# Patient Record
Sex: Female | Born: 1980 | Race: White | Hispanic: No | Marital: Married | Smoking: Never smoker
Health system: Southern US, Community
[De-identification: ages and names within clinical notes are randomized; demographics above are authoritative.]

## PROBLEM LIST (undated history)

## (undated) DIAGNOSIS — B019 Varicella without complication: Secondary | ICD-10-CM

## (undated) DIAGNOSIS — C801 Malignant (primary) neoplasm, unspecified: Secondary | ICD-10-CM

## (undated) DIAGNOSIS — N390 Urinary tract infection, site not specified: Secondary | ICD-10-CM

## (undated) HISTORY — DX: Varicella without complication: B01.9

## (undated) HISTORY — DX: Urinary tract infection, site not specified: N39.0

## (undated) HISTORY — DX: Malignant (primary) neoplasm, unspecified: C80.1

---

## 1999-02-03 ENCOUNTER — Emergency Department (HOSPITAL_COMMUNITY): Admission: EM | Admit: 1999-02-03 | Discharge: 1999-02-03 | Payer: Self-pay | Admitting: Emergency Medicine

## 1999-09-28 ENCOUNTER — Emergency Department (HOSPITAL_COMMUNITY): Admission: EM | Admit: 1999-09-28 | Discharge: 1999-09-28 | Payer: Self-pay

## 2009-08-01 ENCOUNTER — Ambulatory Visit (HOSPITAL_COMMUNITY): Admission: RE | Admit: 2009-08-01 | Discharge: 2009-08-01 | Payer: Self-pay | Admitting: Gynecology

## 2011-01-10 IMAGING — RF DG HYSTEROGRAM
6 series · 6 of 6 positions shown · non-contrast
Comparison: none

CLINICAL DATA: Primary infertility.  Assess tubal patency

HYSTEROSALPINGOGRAM
TECHNIQUE: Hysterosalpingogram was performed by the ordering
physician under fluoroscopy.  Fluoroscopic images are submitted for
interpretation following the procedure.
Fluoroscopy Time:  1.5 minutes.

[Series 1: run · 1 of 1 slices shown (1 of 6)]
[im 1/1]
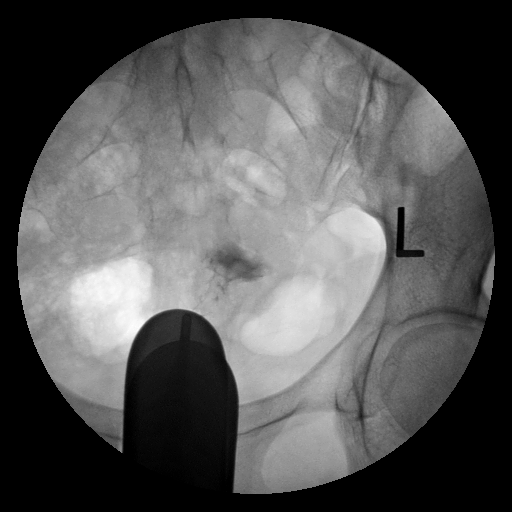

[Series 2: run · 1 of 1 slices shown (2 of 6)]
[im 1/1]
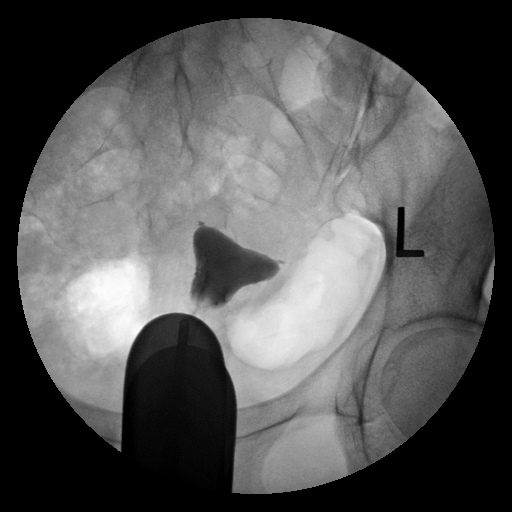

[Series 3: run · 1 of 1 slices shown (3 of 6)]
[im 1/1]
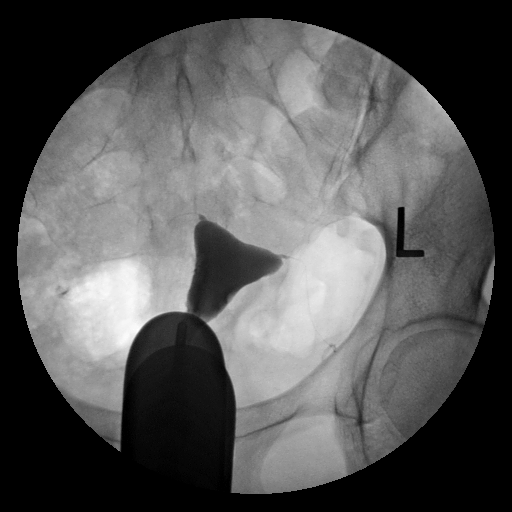

[Series 4: run · 1 of 1 slices shown (4 of 6)]
[im 1/1]
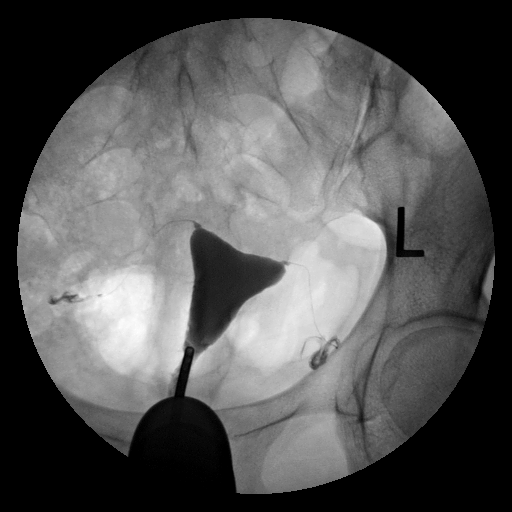

[Series 5: run · 1 of 1 slices shown (5 of 6)]
[im 1/1]
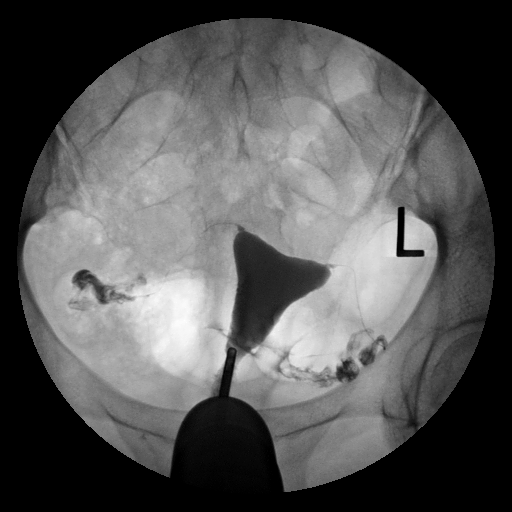

[Series 6: run · 1 of 1 slices shown (6 of 6)]
[im 1/1]
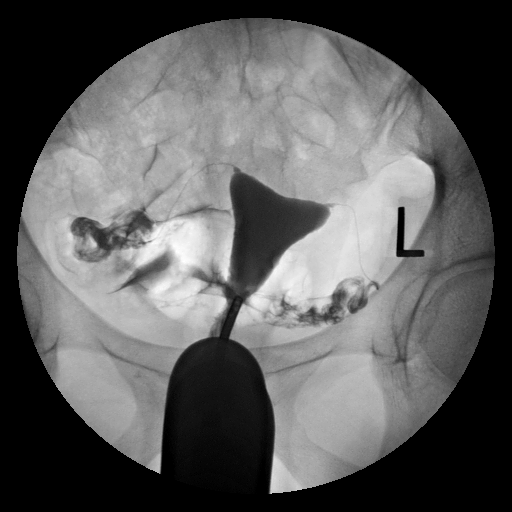

[6 of 6 positions shown; findings below may reference images not displayed]

FINDINGS: A normal endometrial morphology is seen.  Both fallopian
tubes have a normal morphology and bilateral free intraperitoneal
spill is seen.  No evidence for loculation of contrast is noted to
suggest the presence of peritubal or periovarian adhesions.
IMPRESSION: Normal HSG.

Approximately 10 ml of Omnipaque 300% was utilized for this exam.

## 2011-08-06 ENCOUNTER — Encounter: Payer: Self-pay | Admitting: Family Medicine

## 2011-08-06 ENCOUNTER — Ambulatory Visit (INDEPENDENT_AMBULATORY_CARE_PROVIDER_SITE_OTHER): Payer: BC Managed Care – PPO | Admitting: Family Medicine

## 2011-08-06 DIAGNOSIS — F341 Dysthymic disorder: Secondary | ICD-10-CM

## 2011-08-06 DIAGNOSIS — F329 Major depressive disorder, single episode, unspecified: Secondary | ICD-10-CM

## 2011-08-06 DIAGNOSIS — N979 Female infertility, unspecified: Secondary | ICD-10-CM

## 2011-08-06 NOTE — Progress Notes (Signed)
  Subjective:    Patient ID: Stephanie Owen, female    DOB: Jun 21, 1981, 31 y.o.   MRN: [id number]  HPI New to establish.  Previous MD- [facility], Eileen Stanford.  GYN- [facility].  Infertility- has had complete w/u w/ Dr Thamas Jaegers, dx'd w/ 'unexplained infertility'.  In process of saving for IVF.  Admits to some depression due to inability to have kids.  Has had 'crazy amount of testing'- uncertain as to what bloodwork has been done.   Review of Systems For ROS see HPI     Objective:   Physical Exam  Constitutional: She is oriented to person, place, and time. She appears well-developed and well-nourished. No distress.  HENT:  Head: Normocephalic and atraumatic.  Eyes: Conjunctivae and EOM are normal. Pupils are equal, round, and reactive to light.  Neck: Normal range of motion. Neck supple. No thyromegaly present.  Cardiovascular: Normal rate, regular rhythm, normal heart sounds and intact distal pulses.   No murmur heard. Pulmonary/Chest: Effort normal and breath sounds normal. No respiratory distress.  Musculoskeletal: She exhibits no edema.  Lymphadenopathy:    She has no cervical adenopathy.  Neurological: She is alert and oriented to person, place, and time.  Skin: Skin is warm and dry.  Psychiatric: She has a normal mood and affect. Her behavior is normal.          Assessment & Plan:

## 2011-08-06 NOTE — Patient Instructions (Signed)
Welcome!  We're glad to have you! Call with any questions or concerns Think of [place] as your home base- if you need anything, let us know! Hang in there!!

## 2011-08-09 DIAGNOSIS — N979 Female infertility, unspecified: Secondary | ICD-10-CM | POA: Insufficient documentation

## 2011-08-09 DIAGNOSIS — F329 Major depressive disorder, single episode, unspecified: Secondary | ICD-10-CM | POA: Insufficient documentation

## 2011-08-09 NOTE — Assessment & Plan Note (Signed)
New.  Pt admits to some depression over their infertility struggles.  Reports sxs are well controlled at this time b/c things are 'on hold' while they save $ for IVF.  Will continue to follow.

## 2011-08-09 NOTE — Assessment & Plan Note (Signed)
New.  Pt is going through work up and saving money for IVF.  Will continue to follow and assist as able.

## 2012-11-28 ENCOUNTER — Ambulatory Visit (INDEPENDENT_AMBULATORY_CARE_PROVIDER_SITE_OTHER): Payer: BC Managed Care – PPO | Admitting: Family Medicine

## 2012-11-28 ENCOUNTER — Encounter: Payer: Self-pay | Admitting: Family Medicine

## 2012-11-28 VITALS — BP 110/70 | HR 80 | Temp 98.2°F | Ht 60.5 in | Wt 108.0 lb

## 2012-11-28 DIAGNOSIS — IMO0002 Reserved for concepts with insufficient information to code with codable children: Secondary | ICD-10-CM

## 2012-11-28 DIAGNOSIS — N912 Amenorrhea, unspecified: Secondary | ICD-10-CM

## 2012-11-28 DIAGNOSIS — S86899A Other injury of other muscle(s) and tendon(s) at lower leg level, unspecified leg, initial encounter: Secondary | ICD-10-CM

## 2012-11-28 LAB — HCG, QUANTITATIVE, PREGNANCY: hCG, Beta Chain, Quant, S: 0.91 m[IU]/mL

## 2012-11-28 MED ORDER — MELOXICAM 15 MG PO TABS: 15.0000 mg | ORAL_TABLET | Freq: Every day | ORAL | Status: DC

## 2012-11-28 NOTE — Progress Notes (Signed)
  Subjective:    Patient ID: [name], female    DOB: 08-06-1980, 32 y.o.   MRN: [id number]  HPI Leg pain- 'horrible pain for over a month now'.  R lower leg.  Feels it may be shin splints b/c she is doing a lot more running than previous.  'constantly hurts'.  Will throb at rest and a stabbing pain w/ walking.  No pain while running but severe pain after.  Amenorrhea- period is 2 weeks late and pt has been trying to conceive x7 yrs.   Review of Systems For ROS see HPI     Objective:   Physical Exam  Vitals reviewed. Constitutional: She is oriented to person, place, and time. She appears well-developed and well-nourished. No distress.  Musculoskeletal: Normal range of motion. She exhibits tenderness (TTP of R anteromedial tibia.  no pain w/ placement of tuning fork on tibia.  no obvious bony abnormality). She exhibits no edema.  Neurological: She is alert and oriented to person, place, and time. She has normal reflexes. Coordination normal.  Skin: Skin is warm and dry.  Psychiatric: She has a normal mood and affect. Her behavior is normal.          Assessment & Plan:

## 2012-11-28 NOTE — Patient Instructions (Addendum)
We'll notify you of your lab results DO NOT take the Mobic until we know if you are pregnant ICE your shins daily Do the toe taps to strengthen your anterior shin muscles Compression socks while running Try and vary your exercises STRETCH regularly If no improvement, please call and we'll refer you to Sports Med Hang in there!!!

## 2012-11-29 ENCOUNTER — Telehealth: Payer: Self-pay | Admitting: Family Medicine

## 2012-11-29 NOTE — Assessment & Plan Note (Signed)
New.  Suspect this is due to pt's increase in mileage recently.  Start daily NSAID, ICE, reviewed appropriate stretching and need to strengthen tibialis anterior muscles.  If no improvement, pt to call and we'll refer to sports med.  Will follow.

## 2012-11-29 NOTE — Telephone Encounter (Signed)
Spoke with the pt and informed her of recent lab results and note.  Pt understood.//AB/CMA

## 2012-11-29 NOTE — Telephone Encounter (Signed)
Patient is requesting lab results from yesterday.  °

## 2012-11-29 NOTE — Telephone Encounter (Signed)
LM asking the pt to RTC regarding her lab results.//AB/CMA

## 2012-11-29 NOTE — Assessment & Plan Note (Signed)
New.  Pt is 2 weeks late for period.  Trying to conceive for 7 yrs.  Home Upreg (-) x2.  Will get serum beta HCG.

## 2013-02-13 ENCOUNTER — Telehealth: Payer: Self-pay | Admitting: Family Medicine

## 2013-02-13 DIAGNOSIS — S86899A Other injury of other muscle(s) and tendon(s) at lower leg level, unspecified leg, initial encounter: Secondary | ICD-10-CM

## 2013-02-13 NOTE — Telephone Encounter (Signed)
Patient states that her shin splints that she complained about at her last visit, 11/28/12, are not improving and wants to know if she can be referred to a Sports Medicine specialist. Please advise.

## 2013-02-13 NOTE — Telephone Encounter (Signed)
Referral placed for sports medicine as stated in prior office note.

## 2013-02-15 ENCOUNTER — Ambulatory Visit (INDEPENDENT_AMBULATORY_CARE_PROVIDER_SITE_OTHER): Payer: BC Managed Care – PPO | Admitting: Family Medicine

## 2013-02-15 ENCOUNTER — Encounter: Payer: Self-pay | Admitting: Family Medicine

## 2013-02-15 VITALS — BP 115/67 | HR 76 | Ht 61.0 in | Wt 105.0 lb

## 2013-02-15 DIAGNOSIS — S86899A Other injury of other muscle(s) and tendon(s) at lower leg level, unspecified leg, initial encounter: Secondary | ICD-10-CM

## 2013-02-15 DIAGNOSIS — IMO0002 Reserved for concepts with insufficient information to code with codable children: Secondary | ICD-10-CM

## 2013-02-15 NOTE — Patient Instructions (Addendum)
You have shin splints (medial tibial stress syndrome) Take tylenol and/or aleve as needed for pain. Icing 3-4 times a day and after activity for 15 minutes at a time Cut down activities by 20-50% (especially running). Orthotics or shoe inserts may be helpful if you have foot breakdown or high arches - Dr. Jari Sportsman active series. Cross train with non-impact activities (cycling, swimming) every other day if you are unable to run because of pain. Heel and toe walking exercises to strengthen muscles of lower leg. Calf raise on a step 3 sets of 10 once a day as shown. Buy new shoes at least yearly - this may make a difference for you as well. Follow up with me in 6 weeks or as needed.

## 2013-02-16 ENCOUNTER — Encounter: Payer: Self-pay | Admitting: Family Medicine

## 2013-02-16 NOTE — Progress Notes (Signed)
Patient ID: Stephanie Owen, female   DOB: [birth year], 32 y.o.   MRN: [id number]  PCP: Neena Rhymes, MD  Subjective:   HPI: Patient is a 32 y.o. female here for bilateral shin pain.  Patient states she runs about 25-30 miles per week. Is currently training for a half marathon in October. About April-May she started to develop bilateral shin pain, right worse than left. Occurred primarily after a run initially and has progressed to while running and now even with walking it bothers her. Compression sleeves help some. Has iced. Continued to run through this. Not taking any medicines for pain.  Past Medical History  Diagnosis Date  . Chicken pox   . UTI (urinary tract infection)     Current Outpatient Prescriptions on File Prior to Visit  Medication Sig Dispense Refill  . meloxicam (MOBIC) 15 MG tablet Take 1 tablet (15 mg total) by mouth daily.  30 tablet  3   No current facility-administered medications on file prior to visit.    History reviewed. No pertinent past surgical history.  No Known Allergies  History   Social History  . Marital Status: Married    Spouse Name: N/A    Number of Children: N/A  . Years of Education: N/A   Occupational History  . Not on file.   Social History Main Topics  . Smoking status: Never Smoker   . Smokeless tobacco: Not on file  . Alcohol Use: Yes     Comment: socailly  . Drug Use: No  . Sexual Activity: Not on file   Other Topics Concern  . Not on file   Social History Narrative  . No narrative on file    Family History  Problem Relation Age of Onset  . Hypertension Father   . Sudden death Neg Hx   . Hyperlipidemia Neg Hx   . Heart attack Neg Hx   . Diabetes Neg Hx     BP 115/67  Pulse 76  Ht 5\' 1"  (1.549 m)  Wt 105 lb (47.628 kg)  BMI 19.85 kg/m2  Review of Systems: See HPI above.    Objective:  Physical Exam:  Gen: NAD  Bilateral lower legs: Mild cavus deformity of arches. No other deformity,  bruising, swelling. TTP middle 1/3rd bilateral medial tibias.  No other TTP knee, ankle, legs. FROM ankle and knee without pain, 5/5 strength ankle motions without reproduction of pain. Able to complete hop test with mild pain both shins. Negative fulcrum. NVI distally.  MSK u/s:  Bilateral shins - no evidence of cortical irregularity, edema overlying cortex, increased neovascularity to suggest stress fracture.    Assessment & Plan:  1. Shin splints - discussed dropping running mileage down by 20-50% and not to increase volume at this time.  Icing, strengthening exercises reviewed.  Shoes are about 33 years old - advised to replace these.  Start with Dr. Jari Sportsman active series insoles for support of cavus arches and for cushion from impact.  Tylenol and/or nsaids as needed for pain.  F/u in 6 weeks for reevaluation.

## 2013-02-17 ENCOUNTER — Encounter: Payer: Self-pay | Admitting: Family Medicine

## 2013-02-17 NOTE — Assessment & Plan Note (Signed)
discussed dropping running mileage down by 20-50% and not to increase volume at this time.  Icing, strengthening exercises reviewed.  Shoes are about 33 years old - advised to replace these.  Start with Dr. Jari Sportsman active series insoles for support of cavus arches and for cushion from impact.  Tylenol and/or nsaids as needed for pain.  F/u in 6 weeks for reevaluation.

## 2013-04-18 ENCOUNTER — Ambulatory Visit (INDEPENDENT_AMBULATORY_CARE_PROVIDER_SITE_OTHER): Payer: BC Managed Care – PPO

## 2013-04-18 DIAGNOSIS — Z23 Encounter for immunization: Secondary | ICD-10-CM

## 2013-06-14 ENCOUNTER — Encounter: Payer: Self-pay | Admitting: Family Medicine

## 2013-06-14 ENCOUNTER — Ambulatory Visit (INDEPENDENT_AMBULATORY_CARE_PROVIDER_SITE_OTHER): Payer: BC Managed Care – PPO | Admitting: Family Medicine

## 2013-06-14 VITALS — BP 124/80 | HR 81 | Temp 98.4°F | Resp 16 | Wt 112.1 lb

## 2013-06-14 DIAGNOSIS — J329 Chronic sinusitis, unspecified: Secondary | ICD-10-CM

## 2013-06-14 MED ORDER — BENZONATATE 200 MG PO CAPS: 200.0000 mg | ORAL_CAPSULE | Freq: Three times a day (TID) | ORAL | Status: DC | PRN

## 2013-06-14 MED ORDER — PROMETHAZINE-DM 6.25-15 MG/5ML PO SYRP: 5.0000 mL | ORAL_SOLUTION | Freq: Four times a day (QID) | ORAL | Status: DC | PRN

## 2013-06-14 MED ORDER — AZITHROMYCIN 250 MG PO TABS: ORAL_TABLET | ORAL | Status: DC

## 2013-06-14 NOTE — Patient Instructions (Signed)
Follow up as needed Start the Zpack as directed Drink plenty of fluids Use the cough syrup for nights and weekends (will cause drowsiness) Use the cough pills for daytime Add Sudafed short term, 3-5 days to dry up your congestion and drainage Call with any questions or concerns Hang in there! Happy Holidays!

## 2013-06-14 NOTE — Assessment & Plan Note (Signed)
Pt's sxs and PE consistent w/ infxn.  Start abx.  Cough meds prn.  Reviewed supportive care and red flags that should prompt return.  Pt expressed understanding and is in agreement w/ plan.  

## 2013-06-14 NOTE — Progress Notes (Signed)
   Subjective:    Patient ID: Stephanie Owen, female    DOB: [birth year], 32 y.o.   MRN: [id number]  HPI URI- sxs started 10 days ago.  + head congestion, PND.  Now w/ deep, painful cough.  No facial pain/pressure.  + nausea.  No ear pain.  No tooth pain.  No fevers.  + sick contacts.   Review of Systems For ROS see HPI     Objective:   Physical Exam  Vitals reviewed. Constitutional: She appears well-developed and well-nourished. No distress.  HENT:  Head: Normocephalic and atraumatic.  Right Ear: Tympanic membrane normal.  Left Ear: Tympanic membrane normal.  Nose: Mucosal edema and rhinorrhea present. Right sinus exhibits maxillary sinus tenderness and frontal sinus tenderness. Left sinus exhibits maxillary sinus tenderness and frontal sinus tenderness.  Mouth/Throat: Uvula is midline and mucous membranes are normal. Posterior oropharyngeal erythema present. No oropharyngeal exudate.  Eyes: Conjunctivae and EOM are normal. Pupils are equal, round, and reactive to light.  Neck: Normal range of motion. Neck supple.  Cardiovascular: Normal rate, regular rhythm and normal heart sounds.   Pulmonary/Chest: Effort normal and breath sounds normal. No respiratory distress. She has no wheezes.  Deep hacking cough  Lymphadenopathy:    She has no cervical adenopathy.          Assessment & Plan:

## 2013-06-14 NOTE — Progress Notes (Signed)
Pre visit review using our clinic review tool, if applicable. No additional management support is needed unless otherwise documented below in the visit note. 

## 2013-06-15 ENCOUNTER — Ambulatory Visit: Payer: BC Managed Care – PPO | Admitting: Family Medicine

## 2013-07-26 ENCOUNTER — Ambulatory Visit (INDEPENDENT_AMBULATORY_CARE_PROVIDER_SITE_OTHER): Payer: BC Managed Care – PPO | Admitting: Family

## 2013-07-26 ENCOUNTER — Encounter: Payer: Self-pay | Admitting: Family

## 2013-07-26 VITALS — BP 102/64 | HR 72 | Temp 98.0°F | Resp 16 | Ht 60.5 in | Wt 113.0 lb

## 2013-07-26 DIAGNOSIS — K529 Noninfective gastroenteritis and colitis, unspecified: Secondary | ICD-10-CM | POA: Insufficient documentation

## 2013-07-26 DIAGNOSIS — K5289 Other specified noninfective gastroenteritis and colitis: Secondary | ICD-10-CM

## 2013-07-26 MED ORDER — ONDANSETRON HCL 4 MG PO TABS: 4.0000 mg | ORAL_TABLET | Freq: Three times a day (TID) | ORAL | Status: DC | PRN

## 2013-07-26 NOTE — Patient Instructions (Signed)
Call if abdominal pain worsens, if you cannot keep down liquids, if you develop fever >101 or if you are not feeling better in 1-2 days. Go to ER if you develop severe abdominal pain.

## 2013-07-26 NOTE — Progress Notes (Signed)
Pre visit review using our clinic review tool, if applicable. No additional management support is needed unless otherwise documented below in the visit note. 

## 2013-07-26 NOTE — Progress Notes (Signed)
   Subjective:    Patient ID: Stephanie Owen, female    DOB: 11/12/1980, 33 y.o.   MRN: [id number]  HPI  Stephanie Owen is a 33 yr old female who presents today with chief complaint of nausea. She reports that Saturday 5AM woke up with chills/shaking, nausea, began vomitting x 1-2 hrs.  Nausea continued that day. Had tactile temp.  Woke up at noon.  Went to urgent care.  She was tested negative for flu.  Was told that she had GI virus. Was given phenergan. Sunday she developed severe cramping in her abdomen.  Stayed home all day Sunday.  Yesterday she continued to have some abdominal cramping but developed diarrhea.  Tolerated some bland foods yesterday. Today has only tried clears (which she kept down). Has not tried solids today due to nausea.    Review of Systems    see HPI  Past Medical History  Diagnosis Date  . Chicken pox   . UTI (urinary tract infection)     History   Social History  . Marital Status: Married    Spouse Name: N/A    Number of Children: N/A  . Years of Education: N/A   Occupational History  . Not on file.   Social History Main Topics  . Smoking status: Never Smoker   . Smokeless tobacco: Not on file  . Alcohol Use: Yes     Comment: socailly  . Drug Use: No  . Sexual Activity: Not on file   Other Topics Concern  . Not on file   Social History Narrative  . No narrative on file    No past surgical history on file.  Family History  Problem Relation Age of Onset  . Hypertension Father   . Sudden death Neg Hx   . Hyperlipidemia Neg Hx   . Heart attack Neg Hx   . Diabetes Neg Hx     No Known Allergies  No current outpatient prescriptions on file prior to visit.   No current facility-administered medications on file prior to visit.    BP 102/64  Pulse 72  Temp(Src) 98 F (36.7 C) (Oral)  Resp 16  Ht 5' 0.5" (1.537 m)  Wt 113 lb 0.6 oz (51.275 kg)  BMI 21.70 kg/m2  SpO2 99%    Objective:   Physical Exam  Constitutional: She is oriented  to person, place, and time. She appears well-developed and well-nourished. No distress.  Well appearing white female  HENT:  Head: Normocephalic and atraumatic.  Cardiovascular: Normal rate and regular rhythm.   No murmur heard. Pulmonary/Chest: Effort normal and breath sounds normal. No respiratory distress. She has no wheezes. She has no rales. She exhibits no tenderness.  Abdominal: Soft.  + hypoactive bowel sounds Generalized abdominal tenderness without rebound or guarding  Neurological: She is alert and oriented to person, place, and time.  Psychiatric: She has a normal mood and affect. Her behavior is normal. Judgment and thought content normal.          Assessment & Plan:

## 2013-07-26 NOTE — Assessment & Plan Note (Signed)
Symptoms most consistent with viral gastroenteritis. She is tolerating liquids, afebrile, only mild generalized abdominal tenderness. Discussed course of viral illnesses and that they can last up to 7-10 days. She is currently on day 5. Worst symptom today is nausea. Will rx with zofran as she finds the phenergan too sedating that she was given.  If symptoms worsen or do not improve in the next 1-2 days, we discussed that she will need additional work up.  (i.e. CT abdomen stool studies). She verbalizes understanding. Strict return precautions discussed.

## 2013-11-06 ENCOUNTER — Encounter: Payer: Self-pay | Admitting: Family Medicine

## 2013-11-06 ENCOUNTER — Ambulatory Visit (INDEPENDENT_AMBULATORY_CARE_PROVIDER_SITE_OTHER): Payer: BC Managed Care – PPO | Admitting: Family Medicine

## 2013-11-06 VITALS — BP 110/80 | HR 70 | Temp 98.2°F | Resp 16 | Wt 114.5 lb

## 2013-11-06 DIAGNOSIS — J309 Allergic rhinitis, unspecified: Secondary | ICD-10-CM | POA: Insufficient documentation

## 2013-11-06 MED ORDER — MOMETASONE FUROATE 50 MCG/ACT NA SUSP: 2.0000 | Freq: Every day | NASAL | Status: DC

## 2013-11-06 MED ORDER — PROMETHAZINE-DM 6.25-15 MG/5ML PO SYRP: 5.0000 mL | ORAL_SOLUTION | Freq: Four times a day (QID) | ORAL | Status: DC | PRN

## 2013-11-06 NOTE — Assessment & Plan Note (Signed)
No evidence of bacterial infection.  Suspect a viral/allergy combo.  Start OTC antihistamine and nasal steroid.  Cough meds prn.  Reviewed supportive care and red flags that should prompt return.  Pt expressed understanding and is in agreement w/ plan.

## 2013-11-06 NOTE — Patient Instructions (Signed)
Follow up as needed This appears to be a viral/allergy combo Start Claritin or Zyrtec daily until feeling better Use the nasal spray- 2 sprays each nostril daily Use the cough syrup nightly as needed- will cause drowsiness Mucinex DM for daytime cough Drink plenty of fluids REST! Hang in there!!

## 2013-11-06 NOTE — Progress Notes (Signed)
   Subjective:    Patient ID: Stephanie Owen, female    DOB: [birth year], 33 y.o.   MRN: [id number]  Sinusitis Associated symptoms include coughing.  Cough   URI- sxs started 'over a week ago'.  Was taking OTC allergy and cold meds w/o relief.  No fevers.  + facial pressure, HA, nasal congestion, PND, cough and sneezing.  + sick contacts.  No ear pain.   Review of Systems  Respiratory: Positive for cough.    For ROS see HPI     Objective:   Physical Exam  Vitals reviewed. Constitutional: She appears well-developed and well-nourished. No distress.  HENT:  Head: Normocephalic and atraumatic.  Right Ear: Tympanic membrane normal.  Left Ear: Tympanic membrane normal.  Nose: Mucosal edema and rhinorrhea present. Right sinus exhibits no maxillary sinus tenderness and no frontal sinus tenderness. Left sinus exhibits no maxillary sinus tenderness and no frontal sinus tenderness.  Mouth/Throat: Mucous membranes are normal. Posterior oropharyngeal erythema (w/ PND) present.  Eyes: Conjunctivae and EOM are normal. Pupils are equal, round, and reactive to light.  Neck: Normal range of motion. Neck supple.  Cardiovascular: Normal rate, regular rhythm and normal heart sounds.   Pulmonary/Chest: Effort normal and breath sounds normal. No respiratory distress. She has no wheezes. She has no rales.  Lymphadenopathy:    She has no cervical adenopathy.          Assessment & Plan:

## 2013-11-06 NOTE — Progress Notes (Signed)
Pre visit review using our clinic review tool, if applicable. No additional management support is needed unless otherwise documented below in the visit note. 

## 2015-07-24 ENCOUNTER — Ambulatory Visit (INDEPENDENT_AMBULATORY_CARE_PROVIDER_SITE_OTHER): Payer: BC Managed Care – PPO | Admitting: Family Medicine

## 2015-07-24 ENCOUNTER — Encounter: Payer: Self-pay | Admitting: Family Medicine

## 2015-07-24 VITALS — BP 114/80 | HR 76 | Temp 98.2°F | Ht 60.5 in | Wt 110.4 lb

## 2015-07-24 DIAGNOSIS — J209 Acute bronchitis, unspecified: Secondary | ICD-10-CM

## 2015-07-24 MED ORDER — IPRATROPIUM-ALBUTEROL 0.5-2.5 (3) MG/3ML IN SOLN: 3.0000 mL | Freq: Four times a day (QID) | RESPIRATORY_TRACT | Status: AC

## 2015-07-24 MED ORDER — PROMETHAZINE-DM 6.25-15 MG/5ML PO SYRP: 5.0000 mL | ORAL_SOLUTION | Freq: Four times a day (QID) | ORAL | Status: DC | PRN

## 2015-07-24 MED ORDER — ALBUTEROL SULFATE HFA 108 (90 BASE) MCG/ACT IN AERS: 2.0000 | INHALATION_SPRAY | Freq: Four times a day (QID) | RESPIRATORY_TRACT | Status: DC | PRN

## 2015-07-24 MED ORDER — AZITHROMYCIN 250 MG PO TABS: ORAL_TABLET | ORAL | Status: DC

## 2015-07-24 NOTE — Progress Notes (Signed)
Pre visit review using our clinic review tool, if applicable. No additional management support is needed unless otherwise documented below in the visit note. 

## 2015-07-24 NOTE — Assessment & Plan Note (Signed)
Pt's sxs and duration concerning for atypical process.  Start Zpack.  Cough meds prn.  Albuterol HFA PRN b/c cough and air movement improved s/p neb tx in office.  Reviewed supportive care and red flags that should prompt return.  Pt expressed understanding and is in agreement w/ plan.

## 2015-07-24 NOTE — Progress Notes (Signed)
   Subjective:    Patient ID: Stephanie Owen, female    DOB: [birth year], 35 y.o.   MRN: [id number]  HPI URI- pt reports cough started 3 weeks ago.  No fevers.  + nasal congestion.  Now w/ sore throat.  + HA, some sinus pressure.  Taking ibuprofen.  No ear pain.  Cough is mostly dry, hacking.  + sick contacts.  No N/V.`   Review of Systems For ROS see HPI     Objective:   Physical Exam  Constitutional: She appears well-developed and well-nourished. No distress.  HENT:  Head: Normocephalic and atraumatic.  TMs normal bilaterally Mild nasal congestion Throat w/out erythema, edema, or exudate  Eyes: Conjunctivae and EOM are normal. Pupils are equal, round, and reactive to light.  Neck: Normal range of motion. Neck supple.  Cardiovascular: Normal rate, regular rhythm, normal heart sounds and intact distal pulses.   No murmur heard. Pulmonary/Chest: Effort normal and breath sounds normal. No respiratory distress. She has no wheezes.  + hacking cough No wheezing but decreased air movement throughout- improved s/p neb tx in office  Lymphadenopathy:    She has no cervical adenopathy.  Vitals reviewed.         Assessment & Plan:

## 2015-07-24 NOTE — Patient Instructions (Signed)
Follow up as needed Start the Zpack as directed for the bronchitis Drink plenty of fluids REST! Use the inhaler- 2 puffs every 4 hrs as needed for coughing fits, shortness of breath Use the cough syrup for nights/weekends- will cause drowsiness Call with any questions or concerns Hang in there!!!

## 2015-07-29 ENCOUNTER — Telehealth: Payer: Self-pay | Admitting: Family Medicine

## 2015-07-29 MED ORDER — AMOXICILLIN 875 MG PO TABS: 875.0000 mg | ORAL_TABLET | Freq: Two times a day (BID) | ORAL | Status: DC

## 2015-07-29 NOTE — Telephone Encounter (Signed)
Can switch to Amox  BID x10 days.  Make sure she is taking daily OTC Zyrtec, Mucinex DM for cough/congestion.  If no improvement, will need to return for evaluation and CXR

## 2015-07-29 NOTE — Telephone Encounter (Signed)
Called patient. States she finished ABT yesterday. Congestion worse, cough no better,her eyes a swollen shut in the mornings. States symptoms have worsened she she was last here.

## 2015-07-29 NOTE — Telephone Encounter (Signed)
Medication sent to pharmacy. Left message on patients answering machine. 

## 2015-07-29 NOTE — Telephone Encounter (Signed)
Pt was in 07/24/15 for bronchitis and walking pneumonia and states she finished zpack yesterday but is not feeling any better. Would like call from nurse/cma at [phone].

## 2015-08-16 ENCOUNTER — Ambulatory Visit (HOSPITAL_BASED_OUTPATIENT_CLINIC_OR_DEPARTMENT_OTHER)
Admission: RE | Admit: 2015-08-16 | Discharge: 2015-08-16 | Disposition: A | Discharge status: Home / Self Care | Payer: BC Managed Care – PPO | Source: Ambulatory Visit | Attending: Medical | Admitting: Medical

## 2015-08-16 ENCOUNTER — Telehealth: Payer: Self-pay | Admitting: Family Medicine

## 2015-08-16 ENCOUNTER — Ambulatory Visit (INDEPENDENT_AMBULATORY_CARE_PROVIDER_SITE_OTHER): Payer: BC Managed Care – PPO | Admitting: Medical

## 2015-08-16 VITALS — BP 108/60 | HR 55 | Temp 98.1°F | Ht 60.5 in | Wt 108.2 lb

## 2015-08-16 DIAGNOSIS — R0981 Nasal congestion: Secondary | ICD-10-CM

## 2015-08-16 DIAGNOSIS — R059 Cough, unspecified: Secondary | ICD-10-CM

## 2015-08-16 DIAGNOSIS — R05 Cough: Secondary | ICD-10-CM | POA: Diagnosis not present

## 2015-08-16 DIAGNOSIS — R062 Wheezing: Secondary | ICD-10-CM | POA: Diagnosis not present

## 2015-08-16 MED ORDER — FLUTICASONE PROPIONATE 50 MCG/ACT NA SUSP: 2.0000 | Freq: Every day | NASAL | Status: DC

## 2015-08-16 MED ORDER — CEFDINIR 300 MG PO CAPS: 300.0000 mg | ORAL_CAPSULE | Freq: Two times a day (BID) | ORAL | Status: DC

## 2015-08-16 MED ORDER — BECLOMETHASONE DIPROPIONATE 40 MCG/ACT IN AERS: 2.0000 | INHALATION_SPRAY | Freq: Two times a day (BID) | RESPIRATORY_TRACT | Status: DC

## 2015-08-16 NOTE — Patient Instructions (Signed)
For your cough on and off for 6 weeks will get chest xray. Would recommend you start your benzonatate for cough and stop promethazine syrup since syurup not helping.  For wheezing. Rx qvar inhaler and use albuterol if needed.  For intermittent nasal congestion and some pnd on exam rx flonase.  I am making cefdnir available to use  if cxr positive or if you symptoms worsen as discussed.  Follow up in 7-10 days or if needed.  If symptoms persist consider referral to pulmonologist.

## 2015-08-16 NOTE — Telephone Encounter (Signed)
Patient declined receiving flu shot  °

## 2015-08-16 NOTE — Progress Notes (Signed)
Pre visit review using our clinic review tool, if applicable. No additional management support is needed unless otherwise documented below in the visit note. 

## 2015-08-16 NOTE — Progress Notes (Signed)
Subjective:    Patient ID: Stephanie Owen, female    DOB: 24-Jan-1981, 35 y.o.   MRN: [id number]  HPI  Pt in for 6 weeks of illness(over past 6 weeks complains of cough and chest congestion but minimal nasal congestion). The nasal congestion is very minimal and transient. But no sinus pressure Pt was seen 3 wks ago. Treated for bronchitis with zpack and breathing treatment. Neb treatmtent in office and inhaler at home. Pt states she did not get better. She called and had amoxicillin called in. She states finished amoxicillin about 5 days ago.  With amoxicillin she felt some better. Cough did decrease. Pt has been using promethazine DM. But last 5 days symptoms of cough, chest congestion and wheezing returning. Pt feels like with cough she is wheezing at times. Cough is more at night.  LMP- pt is on medication for endometrial cancer. No menses for 3 months. On med to stop cycles per pt.   Review of Systems  Constitutional: Negative for fever, chills and fatigue.  HENT: Positive for congestion and postnasal drip. Negative for sinus pressure and sneezing.   Respiratory: Positive for cough and wheezing. Negative for chest tightness and shortness of breath.   Cardiovascular: Negative for chest pain and palpitations.  Gastrointestinal: Negative for abdominal pain.  Musculoskeletal: Negative for back pain.  Neurological: Negative for dizziness and headaches.  Hematological: Negative for adenopathy. Does not bruise/bleed easily.  Psychiatric/Behavioral: Negative for behavioral problems and confusion.   Past Medical History  Diagnosis Date  . Chicken pox   . UTI (urinary tract infection)     Social History   Social History  . Marital Status: Married    Spouse Name: N/A  . Number of Children: N/A  . Years of Education: N/A   Occupational History  . Not on file.   Social History Main Topics  . Smoking status: Never Smoker   . Smokeless tobacco: Not on file  . Alcohol Use: Yes   Comment: socailly  . Drug Use: No  . Sexual Activity: Not on file   Other Topics Concern  . Not on file   Social History Narrative    No past surgical history on file.  Family History  Problem Relation Age of Onset  . Hypertension Father   . Sudden death Neg Hx   . Hyperlipidemia Neg Hx   . Heart attack Neg Hx   . Diabetes Neg Hx     No Known Allergies  Current Outpatient Prescriptions on File Prior to Visit  Medication Sig Dispense Refill  . megestrol (MEGACE) 40 MG tablet Take 4 tablets by mouth daily. Reported on 08/16/2015    . albuterol (PROVENTIL HFA;VENTOLIN HFA) 108 (90 Base) MCG/ACT inhaler Inhale 2 puffs into the lungs every 6 (six) hours as needed for wheezing or shortness of breath. (Patient not taking: Reported on 08/16/2015) 1 Inhaler 0   Current Facility-Administered Medications on File Prior to Visit  Medication Dose Route Frequency Provider Last Rate Last Dose  . ipratropium-albuterol (DUONEB) 0.5-2.5 (3) MG/3ML nebulizer solution 3 mL  3 mL Nebulization Q6H Sheliah Hatch, MD        BP 108/60 mmHg  Pulse 55  Temp(Src) 98.1 F (36.7 C) (Oral)  Ht 5' 0.5" (1.537 m)  Wt 108 lb 3.2 oz (49.079 kg)  BMI 20.78 kg/m2  SpO2 99%  LMP 05/24/2015       Objective:   Physical Exam   General  Mental Status - Alert. General Appearance -  Well groomed. Not in acute distress.  Skin Rashes- No Rashes.  HEENT Head- Normal. Ear Auditory Canal - Left- Normal. Right - Normal.Tympanic Membrane- Left- Normal. Right- Normal. Eye Sclera/Conjunctiva- Left- Normal. Right- Normal. Nose & Sinuses Nasal Mucosa- Left-  Boggy and Congested. Right-  Boggy and  Congested.Bilateral  No maxillary and no frontal sinus pressure. Mouth & Throat Lips: Upper Lip- Normal: no dryness, cracking, pallor, cyanosis, or vesicular eruption. Lower Lip-Normal: no dryness, cracking, pallor, cyanosis or vesicular eruption. Buccal Mucosa- Bilateral- No Aphthous ulcers. Oropharynx- No  Discharge or Erythema. Mild +pnd Tonsils: Characteristics- Bilateral- No Erythema or Congestion. Size/Enlargement- Bilateral- No enlargement. Discharge- bilateral-None.  Neck Neck- Supple. No Masses.   Chest and Lung Exam Auscultation: Breath Sounds:-Clear even and unlabored.  Cardiovascular Auscultation:Rythm- Regular, rate and rhythm. Murmurs & Other Heart Sounds:Ausculatation of the heart reveal- No Murmurs.  Lymphatic Head & Neck General Head & Neck Lymphatics: Bilateral: Description- No Localized lymphadenopathy.        Assessment & Plan:  For your cough on and off for 6 weeks will get chest xray. Would recommend you start your benzonatate for cough and stop promethazine syrup since syurup not helping.  For wheezing. Rx qvar inhaler and use albuterol if needed.  For intermittent nasal congestion and some pnd on exam rx flonase.  I am making cefdnir available to use  if cxr positive or if you symptoms worsen as discussed.  Follow up in 7-10 days or if needed.  If symptoms persist consider referral to pulmonologist.

## 2015-08-19 NOTE — Progress Notes (Signed)
Quick Note:  Pt has seen results on MyChart and message also sent for patient to call back if any questions. ______ 

## 2015-08-20 NOTE — Telephone Encounter (Signed)
Chart updated to reflect 

## 2016-06-29 HISTORY — PX: OTHER SURGICAL HISTORY: SHX169

## 2017-01-24 IMAGING — DX DG CHEST 2V
2 series · 2 of 2 positions shown · non-contrast
Comparison: None.

CLINICAL DATA: Cough for 6 months

EXAM:
CHEST  2 VIEW

[chest pa]
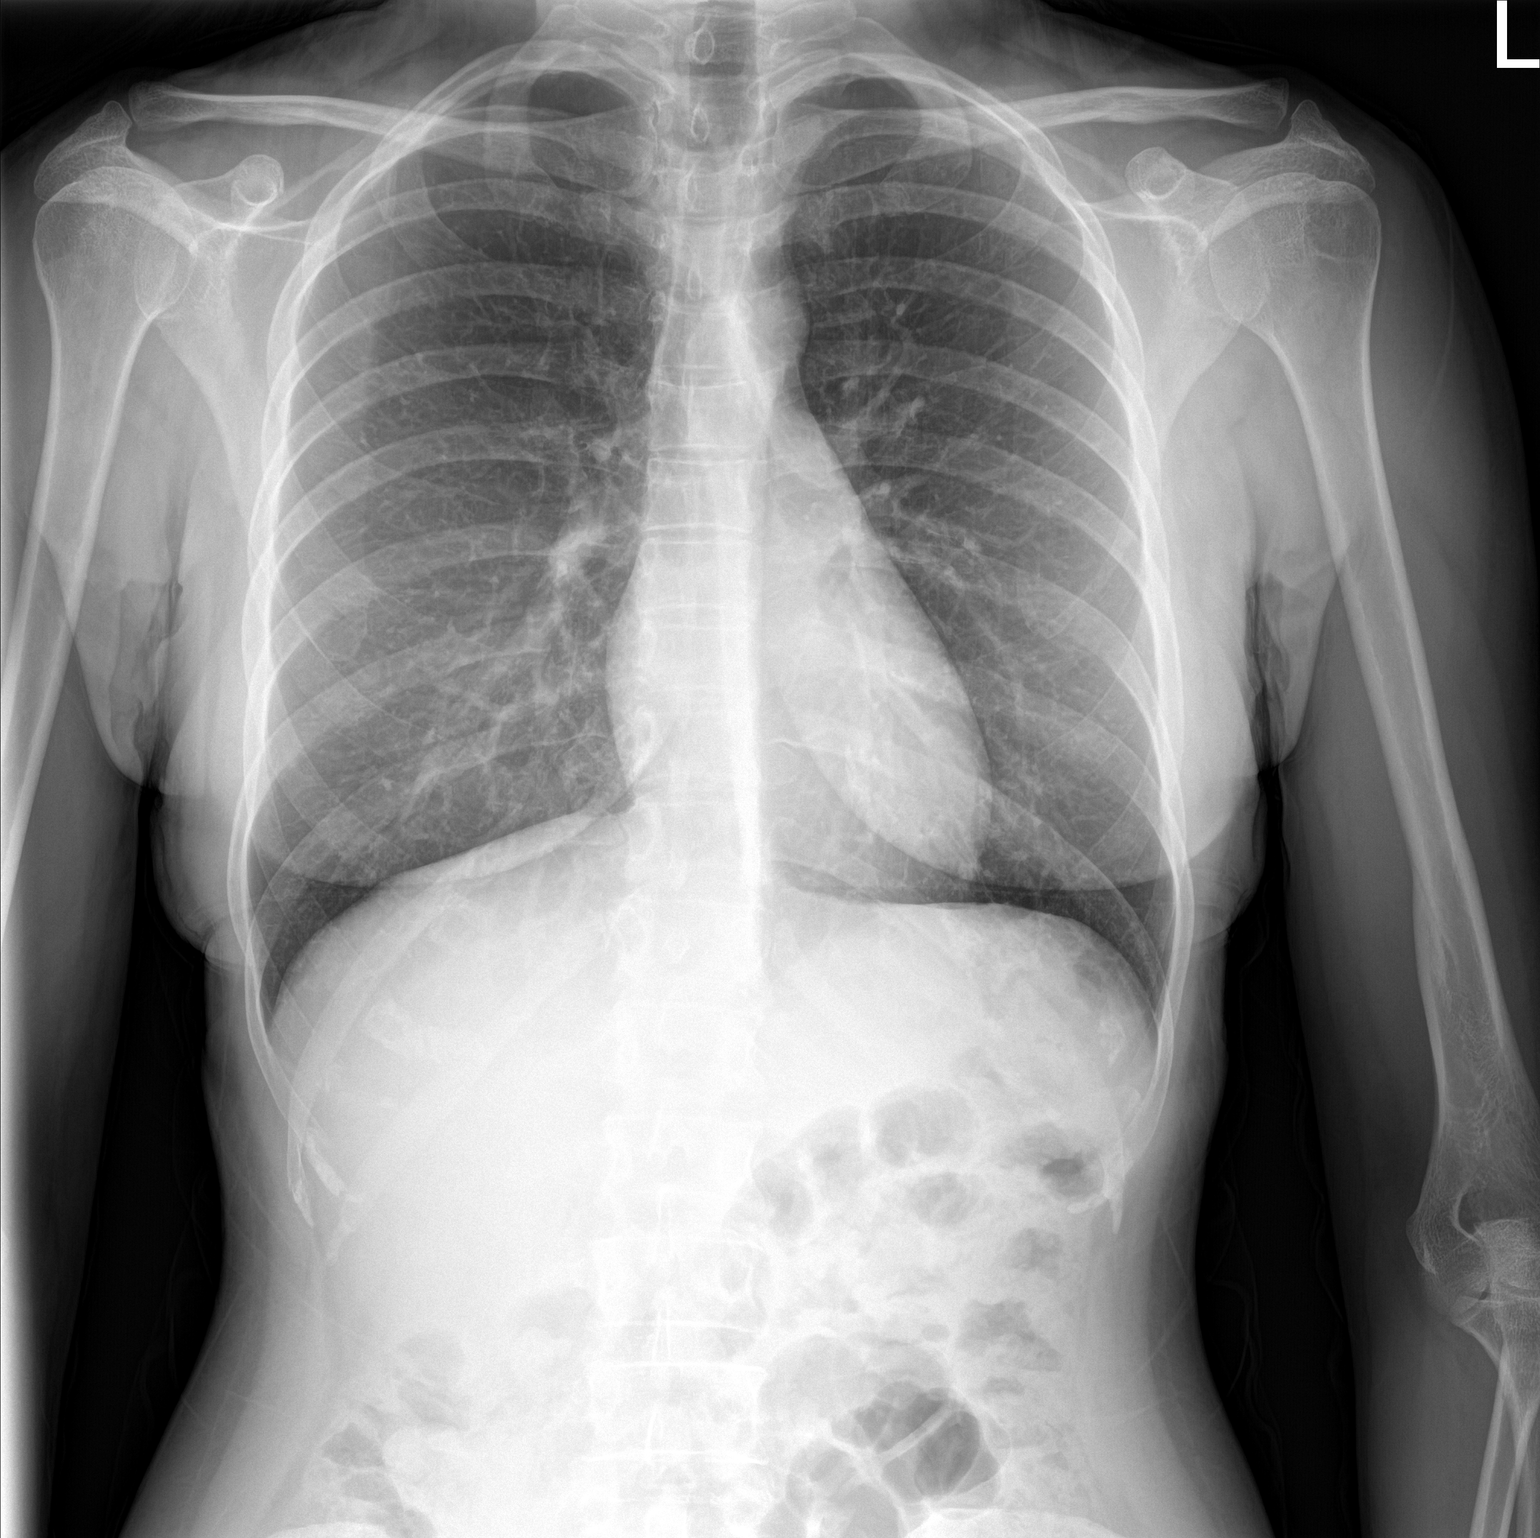

[chest lat]
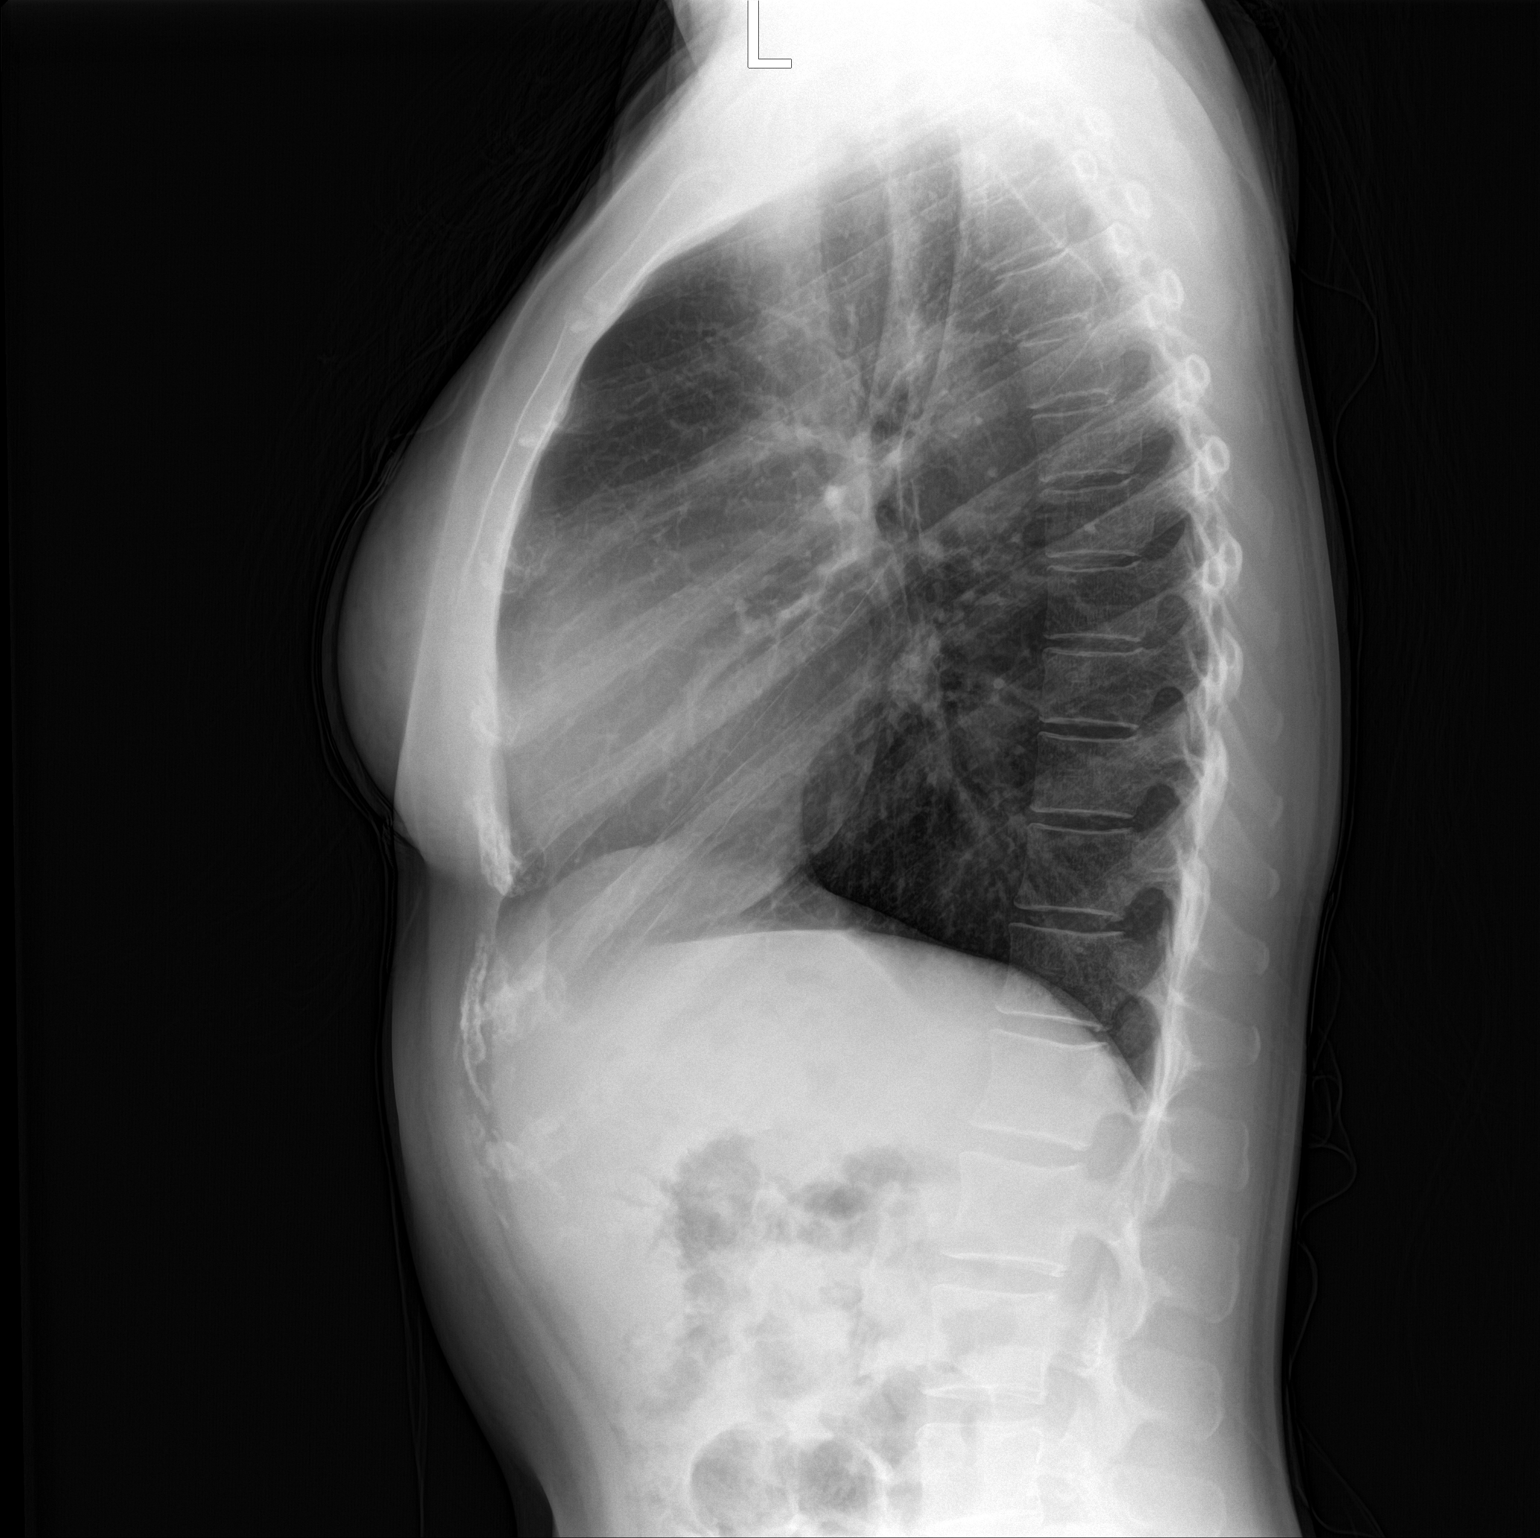

[2 of 2 positions shown; findings below may reference images not displayed]

FINDINGS: The heart size and mediastinal contours are within normal limits.
Both lungs are clear. The visualized skeletal structures are
unremarkable.
IMPRESSION: No active cardiopulmonary disease.

## 2017-05-26 DIAGNOSIS — H52222 Regular astigmatism, left eye: Secondary | ICD-10-CM | POA: Diagnosis not present

## 2017-10-08 DIAGNOSIS — N979 Female infertility, unspecified: Secondary | ICD-10-CM | POA: Diagnosis not present

## 2017-11-16 ENCOUNTER — Encounter: Payer: Self-pay | Admitting: General Practice

## 2018-11-30 DIAGNOSIS — Z6822 Body mass index (BMI) 22.0-22.9, adult: Secondary | ICD-10-CM | POA: Diagnosis not present

## 2018-11-30 DIAGNOSIS — Z01419 Encounter for gynecological examination (general) (routine) without abnormal findings: Secondary | ICD-10-CM | POA: Diagnosis not present

## 2019-01-17 DIAGNOSIS — Z7189 Other specified counseling: Secondary | ICD-10-CM | POA: Diagnosis not present

## 2019-01-17 DIAGNOSIS — Z03818 Encounter for observation for suspected exposure to other biological agents ruled out: Secondary | ICD-10-CM | POA: Diagnosis not present

## 2019-01-22 DIAGNOSIS — Z03818 Encounter for observation for suspected exposure to other biological agents ruled out: Secondary | ICD-10-CM | POA: Diagnosis not present

## 2024-04-21 ENCOUNTER — Encounter: Payer: Self-pay | Admitting: Family

## 2024-04-21 ENCOUNTER — Ambulatory Visit: Payer: Self-pay | Admitting: Family

## 2024-04-21 VITALS — BP 116/78 | HR 78 | Temp 98.3°F | Ht 61.0 in | Wt 100.6 lb

## 2024-04-21 DIAGNOSIS — R1010 Upper abdominal pain, unspecified: Secondary | ICD-10-CM | POA: Diagnosis not present

## 2024-04-21 DIAGNOSIS — Z8542 Personal history of malignant neoplasm of other parts of uterus: Secondary | ICD-10-CM | POA: Diagnosis not present

## 2024-04-21 LAB — COMPREHENSIVE METABOLIC PANEL WITH GFR
ALT: 29 U/L (ref 0–35)
AST: 28 U/L (ref 0–37)
Albumin: 4.5 g/dL (ref 3.5–5.2)
Alkaline Phosphatase: 40 U/L (ref 39–117)
BUN: 15 mg/dL (ref 6–23)
CO2: 25 meq/L (ref 19–32)
Calcium: 9.5 mg/dL (ref 8.4–10.5)
Chloride: 105 meq/L (ref 96–112)
Creatinine, Ser: 0.63 mg/dL (ref 0.40–1.20)
GFR: 107.64 mL/min (ref >60.00)
Glucose, Bld: 90 mg/dL (ref 70–99)
Potassium: 4.1 meq/L (ref 3.5–5.1)
Sodium: 138 meq/L (ref 135–145)
Total Bilirubin: 0.5 mg/dL (ref 0.2–1.2)
Total Protein: 7.4 g/dL (ref 6.0–8.3)

## 2024-04-21 LAB — CBC
HCT: 39.8 % (ref 36.0–46.0)
Hemoglobin: 13 g/dL (ref 12.0–15.0)
MCHC: 32.6 g/dL (ref 30.0–36.0)
MCV: 93.2 fl (ref 78.0–100.0)
Platelets: 298 K/uL (ref 150.0–400.0)
RBC: 4.26 Mil/uL (ref 3.87–5.11)
RDW: 12.8 % (ref 11.5–15.5)
WBC: 4.6 K/uL (ref 4.0–10.5)

## 2024-04-21 LAB — AMYLASE: Amylase: 75 U/L (ref 27–131)

## 2024-04-21 LAB — LIPASE: Lipase: 62 U/L — ABNORMAL HIGH (ref 11.0–59.0)

## 2024-04-21 MED ORDER — OMEPRAZOLE 40 MG PO CPDR: 40.0000 mg | DELAYED_RELEASE_CAPSULE | Freq: Every day | ORAL | 3 refills | Status: AC

## 2024-04-21 NOTE — Patient Instructions (Signed)
  Athletes with peptic ulcer disease who experience squeezing in the epigastric area after running should consider the following lifestyle modifications: avoid NSAIDs and alcohol, stop smoking, adjust meal timing, and modify diet to avoid symptom-triggering foods.   Avoid NSAIDs and alcohol, as they can exacerbate peptic ulcer disease.[1]  Adjust training and eating times to allow for 3-4 hours between eating and exercise.[2]  Modify the size and composition of meals to avoid foods that trigger symptoms.[1-2]  Improve hydration and incorporate a more prolonged warm-up and cool-down routine

## 2024-04-21 NOTE — Progress Notes (Signed)
 Established Patient Office Visit  Subjective:      CC:  Chief Complaint  Patient presents with   Establish Care    HPI: Stephanie Owen is a 43 y.o. female presenting on 04/21/2024 for Establish Care .  Discussed the use of AI scribe software for clinical note transcription with the patient, who gave verbal consent to proceed.  History of Present Illness Stephanie Owen is a 43 year old female who presents with severe upper abdominal pain during [place]-distance running.  She experiences severe upper abdominal pain during [place]-distance running, typically starting around mile thirteen or fourteen. The pain is described as debilitating, causing her to double over and become unable to move. It feels like a muscle cramp or a squeeze that won't release, similar to being stuck in a crunch position. This pain is accompanied by nausea and persists into the next day, impacting her ability to recover and continue training.  She has been training for a marathon and has not experienced issues during runs shorter than thirteen miles. She has tried various hydration and nutrition strategies, including carrying a water bottle with electrolytes and taking salt tablets. However, she avoids gels and gummies due to a sensitive stomach, which makes her feel nauseous. She consumes Gatorade and a specific baby food pouch with bananas, raspberries, and oats before runs, which she tolerates well.  Her medical history includes endometrial cancer, for which she underwent a hysterectomy eight years ago, leaving her ovaries intact. She has been followed by her gynecologist and had a recent check-up with normal blood work results. She has been taking ibuprofen almost daily due to soreness from training, usually with food.  No significant heartburn, acid reflux, changes in bowel movements, or blood in the stool. She has a history of infertility and underwent IVF treatments in the past, which led to the discovery of  her endometrial cancer.  Her family history includes her mother with diabetes and her father with high blood pressure.  She is married for sixteen years, drinks alcohol occasionally, does not use drugs, runs [place] distances, and has a son named Geologist, Engineering. Prior pcp [facility] Greet, last seen in 2017  [facility]necologist, [facility]       Social history:  Relevant past medical, surgical, family and social history reviewed and updated as indicated. Interim medical history since our last visit reviewed.  Allergies and medications reviewed and updated.  DATA REVIEWED: CHART IN EPIC     ROS: Negative unless specifically indicated above in HPI.    Current Outpatient Medications:    omeprazole (PRILOSEC) 40 MG capsule, Take 1 capsule (40 mg total) by mouth daily., Disp: 30 capsule, Rfl: 3  Current Facility-Administered Medications:    ipratropium-albuterol  (DUONEB) 0.5-2.5 (3) MG/3ML nebulizer solution 3 mL, 3 mL, Nebulization, Q6H, Tabori, Katherine E, MD        Objective:        BP 116/78 (BP Location: Left Arm, Patient Position: Sitting)   Pulse 78   Temp 98.3 F (36.8 C) (Temporal)   Ht 5' 1 (1.549 m)   Wt 100 lb 9.6 oz (45.6 kg)   LMP 05/24/2015 Comment: Endometrial Cancer  SpO2 95%   BMI 19.01 kg/m   Physical Exam ABDOMEN: Tenderness in the upper abdomen and epigastric area.  Wt Readings from Last 3 Encounters:  04/21/24 100 lb 9.6 oz (45.6 kg)  08/16/15 108 lb 3.2 oz (49.1 kg)  07/24/15 110 lb 6.4 oz (50.1 kg)    Physical  Exam Vitals reviewed.  Constitutional:      General: She is not in acute distress.    Appearance: Normal appearance. She is normal weight. She is not ill-appearing.  HENT:     Head: Normocephalic.     Right Ear: Tympanic membrane normal.     Left Ear: Tympanic membrane normal.     Nose: Nose normal.     Mouth/Throat:     Mouth: Mucous membranes are moist.  Eyes:     Extraocular Movements: Extraocular movements  intact.     Pupils: Pupils are equal, round, and reactive to light.  Cardiovascular:     Rate and Rhythm: Normal rate and regular rhythm.  Pulmonary:     Effort: Pulmonary effort is normal.     Breath sounds: Normal breath sounds.  Abdominal:     General: Abdomen is flat. Bowel sounds are normal.     Palpations: Abdomen is soft.     Tenderness: There is abdominal tenderness in the epigastric area and left upper quadrant. There is no guarding or rebound.     Hernia: No hernia is present.  Musculoskeletal:        General: Normal range of motion.     Cervical back: Normal range of motion.  Skin:    General: Skin is warm.     Capillary Refill: Capillary refill takes less than 2 seconds.  Neurological:     General: No focal deficit present.     Mental Status: She is alert.  Psychiatric:        Mood and Affect: Mood normal.        Behavior: Behavior normal.        Thought Content: Thought content normal.        Judgment: Judgment normal.          Results   Assessment & Plan:   Assessment and Plan Assessment & Plan Suspected peptic ulcer disease Intermittent severe upper abdominal pain during [place] runs, starting around mile 13-14, accompanied by nausea and prolonged discomfort. Pain is described as a squeezing sensation, relieved by pressing on the area. Differential diagnosis includes peptic ulcer disease, potentially exacerbated by daily ibuprofen use, and possible hernia. No significant heartburn, acid reflux, or changes in bowel movements reported. No blood in stool. Symptoms suggestive of peptic ulcer exacerbated by exercise and NSAID use. Exercise may exacerbate acid reflux, potentially contributing to symptoms. - Prescribe omeprazole for 4-6 weeks to heal potential peptic ulcer. - Order H. pylori breath test to rule out infection. - Check liver function tests and CBC to rule out other causes. - Advise discontinuation of NSAIDs and suggest alternatives like acetaminophen or  diclofenac gel. - Recommend dietary modifications to avoid exacerbating symptoms, such as avoiding spicy, fatty foods, and maintaining hydration. - Advise on proper warm-up and cool-down routines for exercise. - If symptoms persist or worsen, consider CT scan to evaluate for hernia. - Instruct to monitor for signs of GI bleeding or severe pain, and seek immediate care if these occur.  History of endometrial cancer, post-hysterectomy Endometrial cancer diagnosed at age 19, treated with hysterectomy. Ovaries preserved to reduce risk of osteoporosis. No current follow-up with oncologist, but regular gynecological care is maintained. Recent blood work and pelvic exam were normal.  General Health Maintenance No recent primary care visits since 2017. Family history includes diabetes and hypertension. Social history includes minimal alcohol use, no drug use, and regular exercise. No smoking. Regular gynecological follow-up maintained.       Return  in about 2 months (around 06/21/2024) for f/u abdominal pain .     Ginger Patrick, MSN, APRN, FNP-C [facility]

## 2024-04-23 ENCOUNTER — Ambulatory Visit: Payer: Self-pay | Admitting: Family

## 2024-04-23 DIAGNOSIS — R748 Abnormal levels of other serum enzymes: Secondary | ICD-10-CM

## 2024-04-23 DIAGNOSIS — R1010 Upper abdominal pain, unspecified: Secondary | ICD-10-CM

## 2024-04-25 LAB — H. PYLORI BREATH TEST: H. pylori Breath Test: NOT DETECTED

## 2024-04-27 ENCOUNTER — Ambulatory Visit
Admission: RE | Admit: 2024-04-27 | Discharge: 2024-04-27 | Disposition: A | Discharge status: Home / Self Care | Source: Ambulatory Visit | Attending: Family | Admitting: Family

## 2024-04-27 DIAGNOSIS — R1010 Upper abdominal pain, unspecified: Secondary | ICD-10-CM | POA: Diagnosis present

## 2024-04-27 DIAGNOSIS — R748 Abnormal levels of other serum enzymes: Secondary | ICD-10-CM | POA: Insufficient documentation

## 2024-05-01 ENCOUNTER — Ambulatory Visit: Payer: Self-pay | Admitting: Family

## 2024-05-03 ENCOUNTER — Encounter: Payer: Self-pay | Admitting: Family

## 2024-06-19 ENCOUNTER — Ambulatory Visit: Admitting: Family

## 2024-07-13 ENCOUNTER — Ambulatory Visit: Payer: Self-pay

## 2024-07-13 DIAGNOSIS — L720 Epidermal cyst: Secondary | ICD-10-CM | POA: Diagnosis not present

## 2024-07-13 DIAGNOSIS — L905 Scar conditions and fibrosis of skin: Secondary | ICD-10-CM

## 2024-07-13 MED ORDER — DOXYCYCLINE MONOHYDRATE 100 MG PO TABS: ORAL_TABLET | ORAL | 0 refills | Status: AC

## 2024-07-13 NOTE — Patient Instructions (Addendum)

## 2024-07-13 NOTE — Progress Notes (Signed)
" °  °  Subjective   Stephanie Owen is a 44 y.o. female who presents for the following: Lesion(s) of concern . Patient is new patient  Today patient reports: Patient states a lump under right arm, present for 6 months- 1 year. Appeared as a mole, overtime has increased in size. Swelling and painful at touch. Spots on neck that appear the same. Wants to know cream recommendations for face. Does not use any products.   Review of Systems:    No other skin or systemic complaints except as noted in HPI or Assessment and Plan.  The following portions of the chart were reviewed this encounter and updated as appropriate: medications, allergies, medical history  Relevant Medical History:  Family history of skin cancer - melanoma (father)   Endometrial cancer   Objective  (SKPE) Well appearing patient in no apparent distress; mood and affect are within normal limits. Examination was performed of the: Focused Exam of: right axilla, left neck   Examination notable for:  -1 cm mobile cyst at right inferior axilla with overlying pustule, tender Scarring at mandible  Examination limited by: Undergarments, Shoes or socks , and Clothing         Assessment & Plan  (SKAP)   Epidermal Inclusion Cyst at right inferior axilla  - Explained to patient this most likely is consistent with an epidermal inclusion cyst, which represents trapped hair follicule and skin cells under the skin - Endorses intermittent tenderness, drainage  - Benign, patient reassured - Management options discussed in detail, including continued observation, intralesional corticosteroid for inflammation, incision and drainage, antibiotics, and definitive surgical excision  - Risks, benefits of surgical excision were reviewed, including pain, bleeding, infection, scarring, recurrence  - Start doxycycline  100 mg twice daily for 14 days  - Discussed side effects and precautions with doxycycline  including taking with meal, waiting at  least 30 minutes before lying down at night, increased sun sensitivity, and to stop medication if becomes pregnant or breastfeeding -Discussed if doxy does not help calm down inflammation can let us  know sooner and consider excision.   Scarred bumps along mandible  - suspect from cysts/prior acne  - advised do not feel lymphadenopathy but should continue follow up with PCP for continued monitoring   Was sun protection counseling provided?: No   Level of service outlined above   Patient instructions (SKPI)   Procedures, orders, diagnosis for this visit:    There are no diagnoses linked to this encounter.  Return to clinic: Return if symptoms worsen or fail to improve, for w/ Dr. Raymund.  I, Almetta Nora, RMA, am acting as scribe for Lauraine JAYSON Raymund, MD .   Documentation: I have reviewed the above documentation for accuracy and completeness, and I agree with the above.  Lauraine JAYSON Raymund, MD  "
